# Patient Record
Sex: Male | Born: 1950 | Race: White | Hispanic: No | Marital: Married | Smoking: Never smoker
Health system: Southern US, Community
[De-identification: ages and names within clinical notes are randomized; demographics above are authoritative.]

## PROBLEM LIST (undated history)

## (undated) DIAGNOSIS — G709 Myoneural disorder, unspecified: Secondary | ICD-10-CM

## (undated) DIAGNOSIS — K219 Gastro-esophageal reflux disease without esophagitis: Secondary | ICD-10-CM

## (undated) DIAGNOSIS — M1990 Unspecified osteoarthritis, unspecified site: Secondary | ICD-10-CM

## (undated) HISTORY — PX: JOINT REPLACEMENT: SHX530

## (undated) HISTORY — PX: COLONOSCOPY: SHX5424

## (undated) HISTORY — PX: WRIST ARTHROSCOPY: SUR100

## (undated) HISTORY — PX: SHOULDER ARTHROSCOPY: SHX128

## (undated) HISTORY — PX: OTHER SURGICAL HISTORY: SHX169

---

## 2017-02-28 ENCOUNTER — Other Ambulatory Visit: Payer: Self-pay | Admitting: Neurosurgery

## 2017-03-20 ENCOUNTER — Encounter (HOSPITAL_COMMUNITY): Payer: Self-pay | Admitting: Vascular Surgery

## 2017-03-20 ENCOUNTER — Encounter (HOSPITAL_COMMUNITY): Payer: Self-pay

## 2017-03-20 ENCOUNTER — Encounter (HOSPITAL_COMMUNITY)
Admission: RE | Admit: 2017-03-20 | Discharge: 2017-03-20 | Disposition: A | Discharge status: Home / Self Care | Payer: BLUE CROSS/BLUE SHIELD | Source: Ambulatory Visit | Attending: Neurosurgery | Admitting: Neurosurgery

## 2017-03-20 DIAGNOSIS — M4316 Spondylolisthesis, lumbar region: Secondary | ICD-10-CM | POA: Diagnosis not present

## 2017-03-20 DIAGNOSIS — Z01818 Encounter for other preprocedural examination: Secondary | ICD-10-CM | POA: Insufficient documentation

## 2017-03-20 HISTORY — DX: Unspecified osteoarthritis, unspecified site: M19.90

## 2017-03-20 HISTORY — DX: Myoneural disorder, unspecified: G70.9

## 2017-03-20 HISTORY — DX: Gastro-esophageal reflux disease without esophagitis: K21.9

## 2017-03-20 LAB — CBC
HCT: 41.2 % (ref 39.0–52.0)
Hemoglobin: 13.8 g/dL (ref 13.0–17.0)
MCH: 29.9 pg (ref 26.0–34.0)
MCHC: 33.5 g/dL (ref 30.0–36.0)
MCV: 89.2 fL (ref 78.0–100.0)
PLATELETS: 270 10*3/uL (ref 150–400)
RBC: 4.62 MIL/uL (ref 4.22–5.81)
RDW: 13.9 % (ref 11.5–15.5)
WBC: 8.3 10*3/uL (ref 4.0–10.5)

## 2017-03-20 LAB — BASIC METABOLIC PANEL
ANION GAP: 7 (ref 5–15)
BUN: 13 mg/dL (ref 6–20)
CO2: 30 mmol/L (ref 22–32)
Calcium: 10 mg/dL (ref 8.9–10.3)
Chloride: 100 mmol/L — ABNORMAL LOW (ref 101–111)
Creatinine, Ser: 0.91 mg/dL (ref 0.61–1.24)
GFR calc non Af Amer: >60 mL/min (ref >60)
Glucose, Bld: 104 mg/dL — ABNORMAL HIGH (ref 65–99)
Potassium: 4.3 mmol/L (ref 3.5–5.1)
Sodium: 137 mmol/L (ref 135–145)

## 2017-03-20 LAB — SURGICAL PCR SCREEN
MRSA, PCR: NEGATIVE
Staphylococcus aureus: NEGATIVE

## 2017-03-20 NOTE — Progress Notes (Addendum)
PCP is Dr Graceann CongressGarland Hughes  Denies any chest pain  States he had a stress test, card cath, and echo 10 years ago and everything was normal. Dr Enis GashHuggins asked if he could leave his wedding ring on during surgery. Informed him that we usually tell him to remove all jewelry. States that he has left the ring on for all his other surgeries and has never removed it, and wants to leave it on during this surgery. Revonda Standardllison called and she spoke with Dr Maple HudsonMoser - he states it will need to be removed. Informed Dr Enis GashHuggins of this and he states that he will sign what ever he needs to sign so it can stay on.  States he would cancel surgery if he has to remove it. Encouraged him to speak with the Anesthesia Dr on the day of surgery.

## 2017-03-20 NOTE — Pre-Procedure Instructions (Signed)
Sim Choquette  03/20/2017      [facility] - [place], [place] - [address] [facility] [place] [address] Phone: [phone] Fax: [phone]     Your procedure is scheduled on May 31.  Report to [facility]mitting at [phone].M.  Call this number if you have problems the morning of surgery:  [phone]   Remember:  Do not eat food or drink liquids after midnight.  Take these medicines the morning of surgery with A SIP OF WATER Oxycodone-acetaminophen (Percocet) if needed, Tamsulosin (Flomax)  Stop taking aspirin, BC's, Goody's, Herbal medications, Fish Oil, Vitamins, Ibuprofen, Advil, Motrin   Do not wear jewelry, make-up or nail polish.  Do not wear lotions, powders, or perfumes, or deoderant.  Do not shave 48 hours prior to surgery.  Men may shave face and neck.  Do not bring valuables to the hospital.  [facility] is not responsible for any belongings or valuables.  Contacts, dentures or bridgework may not be worn into surgery.  Leave your suitcase in the car.  After surgery it may be brought to your room.  For patients admitted to the hospital, discharge time will be determined by your treatment team.  Patients discharged the day of surgery will not be allowed to drive home.    Special instructions:  [facility] - Preparing for Surgery  Before surgery, you can play an important role.  Because skin is not sterile, your skin needs to be as free of germs as possible.  You can reduce the number of germs on you skin by washing with CHG (chlorahexidine gluconate) soap before surgery.  CHG is an antiseptic cleaner which kills germs and bonds with the skin to continue killing germs even after washing.  Please DO NOT use if you have an allergy to CHG or antibacterial soaps.  If your skin becomes reddened/irritated stop using the CHG and inform your nurse when you arrive at Short Stay.  Do not shave (including legs and  underarms) for at least 48 hours prior to the first CHG shower.  You may shave your face.  Please follow these instructions carefully:   1.  Shower with CHG Soap the night before surgery and the                                morning of Surgery.  2.  If you choose to wash your hair, wash your hair first as usual with your       normal shampoo.  3.  After you shampoo, rinse your hair and body thoroughly to remove the                      Shampoo.  4.  Use CHG as you would any other liquid soap.  You can apply chg directly       to the skin and wash gently with scrungie or a clean washcloth.  5.  Apply the CHG Soap to your body ONLY FROM THE NECK DOWN.        Do not use on open wounds or open sores.  Avoid contact with your eyes,       ears, mouth and genitals (private parts).  Wash genitals (private parts)       with your normal soap.  6.  Wash thoroughly, paying special attention to the area where  your surgery        will be performed.  7.  Thoroughly rinse your body with warm water from the neck down.  8.  DO NOT shower/wash with your normal soap after using and rinsing off       the CHG Soap.  9.  Pat yourself dry with a clean towel.            10.  Wear clean pajamas.            11.  Place clean sheets on your bed the night of your first shower and do not        sleep with pets.  Day of Surgery  Do not apply any lotions/deoderants the morning of surgery.  Please wear clean clothes to the hospital/surgery center.     Please read over the following fact sheets that you were given. Pain Booklet, Coughing and Deep Breathing, MRSA Information and Surgical Site Infection Prevention

## 2017-03-21 NOTE — Anesthesia Preprocedure Evaluation (Addendum)
Anesthesia Evaluation  Patient identified by MRN, date of birth, ID band Patient awake    Reviewed: Allergy & Precautions, NPO status , Patient's Chart, lab work & pertinent test results  Airway Mallampati: II  TM Distance: >3 FB Neck ROM: Full    Dental  (+) Teeth Intact, Dental Advisory Given   Pulmonary    breath sounds clear to auscultation       Cardiovascular  Rhythm:Regular Rate:Normal     Neuro/Psych    GI/Hepatic GERD  ,  Endo/Other    Renal/GU      Musculoskeletal  (+) Arthritis , Osteoarthritis,    Abdominal   Peds  Hematology   Anesthesia Other Findings   Reproductive/Obstetrics                           Lab Results  Component Value Date   WBC 8.3 03/20/2017   HGB 13.8 03/20/2017   HCT 41.2 03/20/2017   MCV 89.2 03/20/2017   PLT 270 03/20/2017   Lab Results  Component Value Date   CREATININE 0.91 03/20/2017   BUN 13 03/20/2017   NA 137 03/20/2017   K 4.3 03/20/2017   CL 100 (L) 03/20/2017   CO2 30 03/20/2017    Anesthesia Physical Anesthesia Plan  ASA: II  Anesthesia Plan: General   Post-op Pain Management:    Induction: Intravenous  Airway Management Planned: Oral ETT  Additional Equipment:   Intra-op Plan:   Post-operative Plan: Extubation in OR  Informed Consent: I have reviewed the patients History and Physical, chart, labs and discussed the procedure including the risks, benefits and alternatives for the proposed anesthesia with the patient or authorized representative who has indicated his/her understanding and acceptance.   Dental advisory given  Plan Discussed with: CRNA, Anesthesiologist and Surgeon  Anesthesia Plan Comments:       Anesthesia Quick Evaluation

## 2017-03-29 ENCOUNTER — Inpatient Hospital Stay (HOSPITAL_COMMUNITY): Payer: BLUE CROSS/BLUE SHIELD | Admitting: Vascular Surgery

## 2017-03-29 ENCOUNTER — Encounter (HOSPITAL_COMMUNITY): Payer: Self-pay | Admitting: *Deleted

## 2017-03-29 ENCOUNTER — Inpatient Hospital Stay (HOSPITAL_COMMUNITY)
Admission: RE | Admit: 2017-03-29 | Discharge: 2017-03-30 | DRG: 460 | Disposition: A | Discharge status: Home / Self Care | Payer: BLUE CROSS/BLUE SHIELD | Source: Ambulatory Visit | Attending: Neurosurgery | Admitting: Neurosurgery

## 2017-03-29 ENCOUNTER — Encounter (HOSPITAL_COMMUNITY)
Admission: RE | Disposition: A | Discharge status: Home / Self Care | Payer: Self-pay | Source: Ambulatory Visit | Attending: Neurosurgery

## 2017-03-29 ENCOUNTER — Inpatient Hospital Stay (HOSPITAL_COMMUNITY): Payer: BLUE CROSS/BLUE SHIELD

## 2017-03-29 DIAGNOSIS — E7800 Pure hypercholesterolemia, unspecified: Secondary | ICD-10-CM | POA: Diagnosis present

## 2017-03-29 DIAGNOSIS — M4316 Spondylolisthesis, lumbar region: Secondary | ICD-10-CM | POA: Diagnosis present

## 2017-03-29 DIAGNOSIS — M1991 Primary osteoarthritis, unspecified site: Secondary | ICD-10-CM | POA: Diagnosis present

## 2017-03-29 DIAGNOSIS — G629 Polyneuropathy, unspecified: Secondary | ICD-10-CM | POA: Diagnosis present

## 2017-03-29 DIAGNOSIS — M5116 Intervertebral disc disorders with radiculopathy, lumbar region: Principal | ICD-10-CM | POA: Diagnosis present

## 2017-03-29 DIAGNOSIS — Z419 Encounter for procedure for purposes other than remedying health state, unspecified: Secondary | ICD-10-CM

## 2017-03-29 DIAGNOSIS — Z96643 Presence of artificial hip joint, bilateral: Secondary | ICD-10-CM | POA: Diagnosis present

## 2017-03-29 DIAGNOSIS — Z79899 Other long term (current) drug therapy: Secondary | ICD-10-CM | POA: Diagnosis not present

## 2017-03-29 DIAGNOSIS — R030 Elevated blood-pressure reading, without diagnosis of hypertension: Secondary | ICD-10-CM | POA: Diagnosis present

## 2017-03-29 DIAGNOSIS — M4606 Spinal enthesopathy, lumbar region: Secondary | ICD-10-CM | POA: Diagnosis present

## 2017-03-29 HISTORY — PX: ANTERIOR LAT LUMBAR FUSION: SHX1168

## 2017-03-29 HISTORY — PX: LUMBAR PERCUTANEOUS PEDICLE SCREW 1 LEVEL: SHX5560

## 2017-03-29 LAB — TYPE AND SCREEN
ABO/RH(D): AB POS
ANTIBODY SCREEN: NEGATIVE

## 2017-03-29 LAB — ABO/RH: ABO/RH(D): AB POS

## 2017-03-29 SURGERY — ANTERIOR LATERAL LUMBAR FUSION 1 LEVEL
Anesthesia: General | Laterality: Right

## 2017-03-29 MED ORDER — ONDANSETRON HCL 4 MG/2ML IJ SOLN: 4.0000 mg | Freq: Four times a day (QID) | INTRAMUSCULAR | Status: DC | PRN

## 2017-03-29 MED ORDER — PANTOPRAZOLE SODIUM 40 MG PO TBEC
40.0000 mg | DELAYED_RELEASE_TABLET | Freq: Every day | ORAL | Status: DC
  Administered 2017-03-29: 40 mg via ORAL
  Filled 2017-03-29: qty 1

## 2017-03-29 MED ORDER — MIDAZOLAM HCL 2 MG/2ML IJ SOLN
INTRAMUSCULAR | Status: AC
  Filled 2017-03-29: qty 2

## 2017-03-29 MED ORDER — OXYCODONE-ACETAMINOPHEN 5-325 MG PO TABS
1.0000 | ORAL_TABLET | ORAL | Status: DC | PRN
  Administered 2017-03-29 – 2017-03-30 (×6): 1 via ORAL
  Filled 2017-03-29 (×3): qty 1
  Filled 2017-03-29: qty 2
  Filled 2017-03-29: qty 1

## 2017-03-29 MED ORDER — ACETAMINOPHEN 650 MG RE SUPP: 650.0000 mg | RECTAL | Status: DC | PRN

## 2017-03-29 MED ORDER — ATORVASTATIN CALCIUM 20 MG PO TABS
20.0000 mg | ORAL_TABLET | Freq: Every day | ORAL | Status: DC
  Administered 2017-03-29: 20 mg via ORAL
  Filled 2017-03-29: qty 1

## 2017-03-29 MED ORDER — DOCUSATE SODIUM 100 MG PO CAPS
100.0000 mg | ORAL_CAPSULE | Freq: Two times a day (BID) | ORAL | Status: DC
  Administered 2017-03-29 – 2017-03-30 (×2): 100 mg via ORAL
  Filled 2017-03-29 (×2): qty 1

## 2017-03-29 MED ORDER — BISACODYL 10 MG RE SUPP: 10.0000 mg | Freq: Every day | RECTAL | Status: DC | PRN

## 2017-03-29 MED ORDER — HYDROMORPHONE HCL 1 MG/ML IJ SOLN
INTRAMUSCULAR | Status: AC
  Filled 2017-03-29: qty 0.5

## 2017-03-29 MED ORDER — METHOCARBAMOL 1000 MG/10ML IJ SOLN
500.0000 mg | Freq: Four times a day (QID) | INTRAVENOUS | Status: DC | PRN
  Filled 2017-03-29: qty 5

## 2017-03-29 MED ORDER — ZOLPIDEM TARTRATE 5 MG PO TABS: 5.0000 mg | ORAL_TABLET | Freq: Every evening | ORAL | Status: DC | PRN

## 2017-03-29 MED ORDER — ZOLPIDEM TARTRATE 5 MG PO TABS
5.0000 mg | ORAL_TABLET | Freq: Every evening | ORAL | Status: DC | PRN
Start: 2017-03-29 — End: 2017-03-30

## 2017-03-29 MED ORDER — ONDANSETRON HCL 4 MG PO TABS: 4.0000 mg | ORAL_TABLET | Freq: Four times a day (QID) | ORAL | Status: DC | PRN

## 2017-03-29 MED ORDER — BUPIVACAINE LIPOSOME 1.3 % IJ SUSP
20.0000 mL | INTRAMUSCULAR | Status: AC
  Administered 2017-03-29: 20 mL
  Filled 2017-03-29: qty 20

## 2017-03-29 MED ORDER — OXYCODONE-ACETAMINOPHEN 5-325 MG PO TABS
ORAL_TABLET | ORAL | Status: AC
  Filled 2017-03-29: qty 1

## 2017-03-29 MED ORDER — FENTANYL CITRATE (PF) 250 MCG/5ML IJ SOLN
INTRAMUSCULAR | Status: AC
  Filled 2017-03-29: qty 5

## 2017-03-29 MED ORDER — HYDROCODONE-ACETAMINOPHEN 5-325 MG PO TABS: 1.0000 | ORAL_TABLET | ORAL | Status: DC | PRN

## 2017-03-29 MED ORDER — ACETAMINOPHEN 500 MG PO TABS: 1000.0000 mg | ORAL_TABLET | Freq: Four times a day (QID) | ORAL | Status: DC | PRN

## 2017-03-29 MED ORDER — PHENOL 1.4 % MT LIQD
1.0000 | OROMUCOSAL | Status: DC | PRN
Start: 2017-03-29 — End: 2017-03-30

## 2017-03-29 MED ORDER — THROMBIN 5000 UNITS EX SOLR
CUTANEOUS | Status: DC | PRN
  Administered 2017-03-29 (×2): 5000 [IU] via TOPICAL

## 2017-03-29 MED ORDER — PROMETHAZINE HCL 25 MG/ML IJ SOLN: 6.2500 mg | INTRAMUSCULAR | Status: DC | PRN

## 2017-03-29 MED ORDER — BUPIVACAINE HCL (PF) 0.5 % IJ SOLN
INTRAMUSCULAR | Status: AC
  Filled 2017-03-29: qty 30

## 2017-03-29 MED ORDER — PROPOFOL 10 MG/ML IV BOLUS
INTRAVENOUS | Status: AC
  Filled 2017-03-29: qty 20

## 2017-03-29 MED ORDER — MENTHOL 3 MG MT LOZG: 1.0000 | LOZENGE | OROMUCOSAL | Status: DC | PRN

## 2017-03-29 MED ORDER — ACETAMINOPHEN 325 MG PO TABS
650.0000 mg | ORAL_TABLET | ORAL | Status: DC | PRN
  Administered 2017-03-29 – 2017-03-30 (×3): 650 mg via ORAL
  Filled 2017-03-29 (×3): qty 2

## 2017-03-29 MED ORDER — MIDAZOLAM HCL 5 MG/5ML IJ SOLN
INTRAMUSCULAR | Status: DC | PRN
  Administered 2017-03-29: 2 mg via INTRAVENOUS

## 2017-03-29 MED ORDER — LIDOCAINE-EPINEPHRINE 1 %-1:100000 IJ SOLN
INTRAMUSCULAR | Status: DC | PRN
  Administered 2017-03-29: 5 mL
  Administered 2017-03-29: 3 mL

## 2017-03-29 MED ORDER — HYDROMORPHONE HCL 1 MG/ML IJ SOLN
0.2500 mg | INTRAMUSCULAR | Status: DC | PRN
  Administered 2017-03-29 (×3): 0.5 mg via INTRAVENOUS

## 2017-03-29 MED ORDER — PREGABALIN 75 MG PO CAPS
225.0000 mg | ORAL_CAPSULE | Freq: Every day | ORAL | Status: DC
  Administered 2017-03-29: 225 mg via ORAL
  Filled 2017-03-29: qty 3

## 2017-03-29 MED ORDER — EPHEDRINE SULFATE 50 MG/ML IJ SOLN
INTRAMUSCULAR | Status: DC | PRN
  Administered 2017-03-29 (×2): 5 mg via INTRAVENOUS
  Administered 2017-03-29 (×2): 10 mg via INTRAVENOUS
  Administered 2017-03-29 (×2): 5 mg via INTRAVENOUS

## 2017-03-29 MED ORDER — FLEET ENEMA 7-19 GM/118ML RE ENEM: 1.0000 | ENEMA | Freq: Once | RECTAL | Status: DC | PRN

## 2017-03-29 MED ORDER — CEFAZOLIN SODIUM-DEXTROSE 2-4 GM/100ML-% IV SOLN
2.0000 g | INTRAVENOUS | Status: AC
  Administered 2017-03-29: 2 g via INTRAVENOUS

## 2017-03-29 MED ORDER — LIDOCAINE-EPINEPHRINE 1 %-1:100000 IJ SOLN
INTRAMUSCULAR | Status: AC
  Filled 2017-03-29: qty 1

## 2017-03-29 MED ORDER — FENTANYL CITRATE (PF) 100 MCG/2ML IJ SOLN
INTRAMUSCULAR | Status: DC | PRN
  Administered 2017-03-29 (×4): 50 ug via INTRAVENOUS
  Administered 2017-03-29: 100 ug via INTRAVENOUS

## 2017-03-29 MED ORDER — ONDANSETRON HCL 4 MG/2ML IJ SOLN
INTRAMUSCULAR | Status: AC
  Filled 2017-03-29: qty 2

## 2017-03-29 MED ORDER — SUCCINYLCHOLINE CHLORIDE 200 MG/10ML IV SOSY
PREFILLED_SYRINGE | INTRAVENOUS | Status: DC | PRN
  Administered 2017-03-29: 100 mg via INTRAVENOUS

## 2017-03-29 MED ORDER — CEFAZOLIN SODIUM-DEXTROSE 2-4 GM/100ML-% IV SOLN: 2.0000 g | INTRAVENOUS | Status: DC

## 2017-03-29 MED ORDER — THROMBIN 5000 UNITS EX SOLR
CUTANEOUS | Status: AC
  Filled 2017-03-29: qty 15000

## 2017-03-29 MED ORDER — ONDANSETRON HCL 4 MG/2ML IJ SOLN
INTRAMUSCULAR | Status: DC | PRN
  Administered 2017-03-29: 4 mg via INTRAVENOUS

## 2017-03-29 MED ORDER — MORPHINE SULFATE (PF) 4 MG/ML IV SOLN: 1.0000 mg | INTRAVENOUS | Status: DC | PRN

## 2017-03-29 MED ORDER — OXYCODONE-ACETAMINOPHEN 7.5-325 MG PO TABS: 0.5000 | ORAL_TABLET | Freq: Two times a day (BID) | ORAL | Status: DC | PRN

## 2017-03-29 MED ORDER — HEMOSTATIC AGENTS (NO CHARGE) OPTIME
TOPICAL | Status: DC | PRN
  Administered 2017-03-29: 1 via TOPICAL

## 2017-03-29 MED ORDER — LACTATED RINGERS IV SOLN
INTRAVENOUS | Status: DC
  Administered 2017-03-29 (×4): via INTRAVENOUS

## 2017-03-29 MED ORDER — PREGABALIN 75 MG PO CAPS: 225.0000 mg | ORAL_CAPSULE | Freq: Every day | ORAL | Status: DC

## 2017-03-29 MED ORDER — SODIUM CHLORIDE 0.9% FLUSH
3.0000 mL | Freq: Two times a day (BID) | INTRAVENOUS | Status: DC
  Administered 2017-03-29: 3 mL via INTRAVENOUS

## 2017-03-29 MED ORDER — LIDOCAINE HCL (CARDIAC) 20 MG/ML IV SOLN
INTRAVENOUS | Status: DC | PRN
  Administered 2017-03-29: 100 mg via INTRAVENOUS

## 2017-03-29 MED ORDER — BUPIVACAINE HCL (PF) 0.5 % IJ SOLN
INTRAMUSCULAR | Status: DC | PRN
  Administered 2017-03-29: 3 mL
  Administered 2017-03-29: 5 mL

## 2017-03-29 MED ORDER — PANTOPRAZOLE SODIUM 40 MG IV SOLR: 40.0000 mg | Freq: Every day | INTRAVENOUS | Status: DC

## 2017-03-29 MED ORDER — KCL IN DEXTROSE-NACL 20-5-0.45 MEQ/L-%-% IV SOLN: INTRAVENOUS | Status: DC

## 2017-03-29 MED ORDER — METHOCARBAMOL 500 MG PO TABS
500.0000 mg | ORAL_TABLET | Freq: Four times a day (QID) | ORAL | Status: DC | PRN
  Administered 2017-03-30: 500 mg via ORAL
  Filled 2017-03-29 (×2): qty 1

## 2017-03-29 MED ORDER — CHLORHEXIDINE GLUCONATE CLOTH 2 % EX PADS: 6.0000 | MEDICATED_PAD | Freq: Once | CUTANEOUS | Status: DC

## 2017-03-29 MED ORDER — TAMSULOSIN HCL 0.4 MG PO CAPS
0.4000 mg | ORAL_CAPSULE | Freq: Every day | ORAL | Status: DC
Start: 2017-03-30 — End: 2017-03-30
  Administered 2017-03-30: 0.4 mg via ORAL
  Filled 2017-03-29 (×2): qty 1

## 2017-03-29 MED ORDER — CEFAZOLIN SODIUM-DEXTROSE 2-4 GM/100ML-% IV SOLN
2.0000 g | Freq: Three times a day (TID) | INTRAVENOUS | Status: AC
  Administered 2017-03-29 – 2017-03-30 (×2): 2 g via INTRAVENOUS
  Filled 2017-03-29: qty 100

## 2017-03-29 MED ORDER — DEXTROSE 5 % IV SOLN
INTRAVENOUS | Status: DC | PRN
  Administered 2017-03-29: 20 ug/min via INTRAVENOUS

## 2017-03-29 MED ORDER — SODIUM CHLORIDE 0.9 % IV SOLN: 250.0000 mL | INTRAVENOUS | Status: DC

## 2017-03-29 MED ORDER — CEFAZOLIN SODIUM-DEXTROSE 2-4 GM/100ML-% IV SOLN
INTRAVENOUS | Status: AC
  Filled 2017-03-29: qty 100

## 2017-03-29 MED ORDER — SODIUM CHLORIDE 0.9% FLUSH
3.0000 mL | INTRAVENOUS | Status: DC | PRN
Start: 2017-03-29 — End: 2017-03-30

## 2017-03-29 MED ORDER — 0.9 % SODIUM CHLORIDE (POUR BTL) OPTIME
TOPICAL | Status: DC | PRN
  Administered 2017-03-29: 1000 mL

## 2017-03-29 MED ORDER — ALUM & MAG HYDROXIDE-SIMETH 200-200-20 MG/5ML PO SUSP: 30.0000 mL | Freq: Four times a day (QID) | ORAL | Status: DC | PRN

## 2017-03-29 MED ORDER — PROPOFOL 10 MG/ML IV BOLUS
INTRAVENOUS | Status: DC | PRN
  Administered 2017-03-29: 200 mg via INTRAVENOUS

## 2017-03-29 MED ORDER — SENNOSIDES-DOCUSATE SODIUM 8.6-50 MG PO TABS: 1.0000 | ORAL_TABLET | Freq: Every evening | ORAL | Status: DC | PRN

## 2017-03-29 SURGICAL SUPPLY — 81 items
BLADE CLIPPER SURG (BLADE) IMPLANT
CAGE MODULUS XLW 10X22X60 - 10 (Cage) ×3 IMPLANT
CARTRIDGE OIL MAESTRO DRILL (MISCELLANEOUS) IMPLANT
CLIP NEUROVISION LG (CLIP) ×6 IMPLANT
CONT SPEC 4OZ CLIKSEAL STRL BL (MISCELLANEOUS) ×3 IMPLANT
COUNTER NEEDLE 20 DBL MAG RED (NEEDLE) ×3 IMPLANT
COVER BACK TABLE 24X17X13 BIG (DRAPES) IMPLANT
COVER BACK TABLE 60X90IN (DRAPES) ×3 IMPLANT
DECANTER SPIKE VIAL GLASS SM (MISCELLANEOUS) IMPLANT
DERMABOND ADVANCED (GAUZE/BANDAGES/DRESSINGS) ×4
DERMABOND ADVANCED .7 DNX12 (GAUZE/BANDAGES/DRESSINGS) ×8 IMPLANT
DIFFUSER DRILL AIR PNEUMATIC (MISCELLANEOUS) IMPLANT
DRAPE C-ARM 42X72 X-RAY (DRAPES) ×6 IMPLANT
DRAPE C-ARMOR (DRAPES) ×6 IMPLANT
DRAPE LAPAROTOMY 100X72X124 (DRAPES) ×6 IMPLANT
DRAPE POUCH INSTRU U-SHP 10X18 (DRAPES) ×6 IMPLANT
DRAPE SURG 17X23 STRL (DRAPES) ×3 IMPLANT
DRSG OPSITE POSTOP 3X4 (GAUZE/BANDAGES/DRESSINGS) ×15 IMPLANT
DURAPREP 26ML APPLICATOR (WOUND CARE) ×6 IMPLANT
ELECT REM PT RETURN 9FT ADLT (ELECTROSURGICAL) ×6
ELECTRODE REM PT RTRN 9FT ADLT (ELECTROSURGICAL) ×4 IMPLANT
GAUZE SPONGE 4X4 12PLY STRL (GAUZE/BANDAGES/DRESSINGS) IMPLANT
GAUZE SPONGE 4X4 16PLY XRAY LF (GAUZE/BANDAGES/DRESSINGS) ×3 IMPLANT
GLOVE BIO SURGEON STRL SZ8 (GLOVE) ×12 IMPLANT
GLOVE BIOGEL PI IND STRL 6.5 (GLOVE) ×2 IMPLANT
GLOVE BIOGEL PI IND STRL 7.5 (GLOVE) ×2 IMPLANT
GLOVE BIOGEL PI IND STRL 8 (GLOVE) ×4 IMPLANT
GLOVE BIOGEL PI IND STRL 8.5 (GLOVE) ×4 IMPLANT
GLOVE BIOGEL PI INDICATOR 6.5 (GLOVE) ×1
GLOVE BIOGEL PI INDICATOR 7.5 (GLOVE) ×1
GLOVE BIOGEL PI INDICATOR 8 (GLOVE) ×2
GLOVE BIOGEL PI INDICATOR 8.5 (GLOVE) ×2
GLOVE ECLIPSE 8.0 STRL XLNG CF (GLOVE) ×6 IMPLANT
GLOVE EXAM NITRILE LRG STRL (GLOVE) IMPLANT
GLOVE EXAM NITRILE XL STR (GLOVE) IMPLANT
GLOVE EXAM NITRILE XS STR PU (GLOVE) IMPLANT
GLOVE SURG SS PI 6.5 STRL IVOR (GLOVE) ×3 IMPLANT
GLOVE SURG SS PI 7.5 STRL IVOR (GLOVE) ×9 IMPLANT
GOWN STRL REUS W/ TWL LRG LVL3 (GOWN DISPOSABLE) ×4 IMPLANT
GOWN STRL REUS W/ TWL XL LVL3 (GOWN DISPOSABLE) ×6 IMPLANT
GOWN STRL REUS W/TWL 2XL LVL3 (GOWN DISPOSABLE) ×6 IMPLANT
GOWN STRL REUS W/TWL LRG LVL3 (GOWN DISPOSABLE) ×2
GOWN STRL REUS W/TWL XL LVL3 (GOWN DISPOSABLE) ×3
GUIDEWIRE NITINOL BEVEL TIP (WIRE) ×12 IMPLANT
KIT BASIN OR (CUSTOM PROCEDURE TRAY) ×3 IMPLANT
KIT DILATOR XLIF 5 (KITS) ×2 IMPLANT
KIT INFUSE XX SMALL 0.7CC (Orthopedic Implant) ×3 IMPLANT
KIT POSITION SURG JACKSON T1 (MISCELLANEOUS) ×3 IMPLANT
KIT ROOM TURNOVER OR (KITS) ×3 IMPLANT
KIT SURGICAL ACCESS MAXCESS 4 (KITS) ×3 IMPLANT
KIT XLIF (KITS) ×1
MARKER SKIN DUAL TIP RULER LAB (MISCELLANEOUS) ×3 IMPLANT
MODULE NVM5 NEXT GEN EMG (NEEDLE) ×3 IMPLANT
NEEDLE HYPO 21X1.5 SAFETY (NEEDLE) ×6 IMPLANT
NEEDLE HYPO 25X1 1.5 SAFETY (NEEDLE) ×6 IMPLANT
NEEDLE I PASS (NEEDLE) ×3 IMPLANT
NS IRRIG 1000ML POUR BTL (IV SOLUTION) ×3 IMPLANT
OIL CARTRIDGE MAESTRO DRILL (MISCELLANEOUS)
PACK LAMINECTOMY NEURO (CUSTOM PROCEDURE TRAY) ×6 IMPLANT
PAD ARMBOARD 7.5X6 YLW CONV (MISCELLANEOUS) ×15 IMPLANT
PATTIES SURGICAL .5 X.5 (GAUZE/BANDAGES/DRESSINGS) IMPLANT
PATTIES SURGICAL .5 X1 (DISPOSABLE) IMPLANT
PATTIES SURGICAL 1X1 (DISPOSABLE) IMPLANT
PUTTY BONE ATTRAX 5CC STRIP (Putty) ×3 IMPLANT
ROD RELINE MAS TI LORD 5.5X40 (Rod) ×6 IMPLANT
SCREW LOCK RELINE 5.5 TULIP (Screw) ×12 IMPLANT
SCREW MAS RELINE 6.5X45 POLY (Screw) ×6 IMPLANT
SCREW MAS RELINE 6.5X50 POLY (Screw) ×6 IMPLANT
SPONGE LAP 4X18 X RAY DECT (DISPOSABLE) IMPLANT
SPONGE SURGIFOAM ABS GEL SZ50 (HEMOSTASIS) ×3 IMPLANT
STAPLER SKIN PROX WIDE 3.9 (STAPLE) ×3 IMPLANT
SUT VIC AB 1 CT1 18XBRD ANBCTR (SUTURE) ×4 IMPLANT
SUT VIC AB 1 CT1 8-18 (SUTURE) ×2
SUT VIC AB 2-0 CT1 18 (SUTURE) ×6 IMPLANT
SUT VIC AB 3-0 SH 8-18 (SUTURE) ×6 IMPLANT
SYR 20CC LL (SYRINGE) ×3 IMPLANT
SYR INSULIN 1ML 31GX6 SAFETY (SYRINGE) IMPLANT
TOWEL GREEN STERILE (TOWEL DISPOSABLE) ×3 IMPLANT
TOWEL GREEN STERILE FF (TOWEL DISPOSABLE) ×3 IMPLANT
TRAY FOLEY W/METER SILVER 16FR (SET/KITS/TRAYS/PACK) ×3 IMPLANT
WATER STERILE IRR 1000ML POUR (IV SOLUTION) ×3 IMPLANT

## 2017-03-29 NOTE — Interval H&P Note (Signed)
History and Physical Interval Note:  03/29/2017 8:49 AM  Omar PollackHenry L Urizar Jr.  has presented today for surgery, with the diagnosis of Spondylolisthesis, Lumbar region  The various methods of treatment have been discussed with the patient and family. After consideration of risks, benefits and other options for treatment, the patient has consented to  Procedure(s) with comments: Right L4-5 Anterolateral lumbar interbody fusion with percutaneous pedicle screws (Right) - Right L4-5 Anterolateral lumbar interbody fusion with percutaneous pedicle screws LUMBAR PERCUTANEOUS PEDICLE SCREW 1 LEVEL (N/A) as a surgical intervention .  The patient's history has been reviewed, patient examined, no change in status, stable for surgery.  I have reviewed the patient's chart and labs.  Questions were answered to the patient's satisfaction.     Latamara Melder D

## 2017-03-29 NOTE — Brief Op Note (Signed)
03/29/2017  1:09 PM  PATIENT:  Omar Conway.  66 y.o. 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  PROCEDURE:  Procedure(s) with comments: Right Lumbar four to five,  Anterolateral lumbar interbody fusion with percutaneous pedicle screws (Right) - Right Lumbar four to five, Anterolateral lumbar interbody fusion with percutaneous pedicle screws LUMBAR PERCUTANEOUS PEDICLE SCREW 1 LEVEL (N/A)  SURGEON:  Surgeon(s) and Role:    Maeola Harman, MD - Primary    * Tia Alert, MD - Assisting  PHYSICIAN ASSISTANT:   ASSISTANTS: Poteat, RN   ANESTHESIA:   general  EBL:  Total I/O In: 2000 [I.V.:2000] Out: 850 [Urine:750; Blood:100]  BLOOD ADMINISTERED:none  DRAINS: none   LOCAL MEDICATIONS USED:  MARCAINE    and LIDOCAINE   SPECIMEN:  No Specimen  DISPOSITION OF SPECIMEN:  N/A  COUNTS:  YES  TOURNIQUET:  * No tourniquets in log *  DICTATION: Patient is a 66 year old with  Spondylolysis, spondylolisthesis L 45 stenosis, lumbago, radiculopathy of the lumbar spine. It was elected to take him to surgery for anterolateral decompression and posterior percutaneous pedicle screw fixation at the L 45 level.    Procedure: Patient was brought to the operating room and placed in a left lateral decubitus position on the operative table and using orthogonally projected C-arm fluoroscopy the patient was placed so that the L 45 level was visualized in AP and lateral plane. The patient was then taped into position. The table was flexed so as to expose the L 45 level as the patient has a high iliac crest. Skin was marked along with a posterior finger dissection incision. His flank was then prepped and draped in usual sterile fashion and incisions were made sequentially at L 45 level  from the right side. Posterior finger dissection was made to enter the retroperitoneal space and then subsequently the probe was inserted into the psoas muscle from the right side initially at the L 45 level. After mapping the neural elements were able to dock the probe per the midpoint of this vertebral level and without indications electrically of too close proximity to the neural tissues. Subsequently the self-retaining tractor was.after sequential dilators were utilized the shim was employed and the interspace was cleared of psoas muscle and then incised. A thorough discectomy was performed. Angled instruments were used to clear the interspace of disc material.An anterior entry with posterior trajectory was performed to avoid neural elements.   After thorough discectomy was performed and this was performed using AP and lateral fluoroscopy a 10 lordotic by 60 x 22 mm implant was packed with extra extra small BMP and Attrax. This was tamped into position using the slides and its position was confirmed on AP and lateral fluoroscopy. Hemostasis was assured the wounds were irrigated and closed with interrupted Vicryl sutures.  Sterile occlusive dressings were placed. Retractor time was:  L45: 20 minutes.   Patient was then turned into a prone position on the operating table on Jackson table and using AP and lateral fluoroscopy throughout this portion of the procedure, pedicle screws were placed using Reline Nuvasive cannulated percutaneous screws. 2 screws were placed at L4 and (6.5 x 50 mm) and 2 at L5 (6.5 x 45).  40 mm rod was then affixed to the screw heads do a separate stab incision and locked down on the screws on  the left and 40 mm rod on the right. All connections were then torqued and the Towers were disassembled. The wounds were irrigated and then closed with 1, 2-0 and 3-0 Vicryl stitches. Sterile occlusive dressing was placed with Dermabond. Long-acting Marcaine was injected. The patient was then  extubated in the operating room and taken to recovery in stable and satisfactory condition having tolerated his operation well. Counts were correct at the end of the case.  Pelvic Parameters:  Preop: PT 16; PI 67; LL73; PI-LL -6  PLAN OF CARE: Admit to inpatient   PATIENT DISPOSITION:  PACU - hemodynamically stable.   Delay start of Pharmacological VTE agent (>24hrs) due to surgical blood loss or risk of bleeding: yes

## 2017-03-29 NOTE — Op Note (Signed)
03/29/2017  1:09 PM  PATIENT:  Omar Conway.  66 y.o. 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  PROCEDURE:  Procedure(s) with comments: Right Lumbar four to five,  Anterolateral lumbar interbody fusion with percutaneous pedicle screws (Right) - Right Lumbar four to five, Anterolateral lumbar interbody fusion with percutaneous pedicle screws LUMBAR PERCUTANEOUS PEDICLE SCREW 1 LEVEL (N/A)  SURGEON:  Surgeon(s) and Role:    Maeola Harman, MD - Primary    * Tia Alert, MD - Assisting  PHYSICIAN ASSISTANT:   ASSISTANTS: Poteat, RN   ANESTHESIA:   general  EBL:  Total I/O In: 2000 [I.V.:2000] Out: 850 [Urine:750; Blood:100]  BLOOD ADMINISTERED:none  DRAINS: none   LOCAL MEDICATIONS USED:  MARCAINE    and LIDOCAINE   SPECIMEN:  No Specimen  DISPOSITION OF SPECIMEN:  N/A  COUNTS:  YES  TOURNIQUET:  * No tourniquets in log *  DICTATION: Patient is a 66 year old with  Spondylolysis, spondylolisthesis L 45 stenosis, lumbago, radiculopathy of the lumbar spine. It was elected to take him to surgery for anterolateral decompression and posterior percutaneous pedicle screw fixation at the L 45 level.    Procedure: Patient was brought to the operating room and placed in a left lateral decubitus position on the operative table and using orthogonally projected C-arm fluoroscopy the patient was placed so that the L 45 level was visualized in AP and lateral plane. The patient was then taped into position. The table was flexed so as to expose the L 45 level as the patient has a high iliac crest. Skin was marked along with a posterior finger dissection incision. His flank was then prepped and draped in usual sterile fashion and incisions were made sequentially at L 45 level  from the right side. Posterior finger dissection was made to enter the retroperitoneal space and then subsequently the probe was inserted into the psoas muscle from the right side initially at the L 45 level. After mapping the neural elements were able to dock the probe per the midpoint of this vertebral level and without indications electrically of too close proximity to the neural tissues. Subsequently the self-retaining tractor was.after sequential dilators were utilized the shim was employed and the interspace was cleared of psoas muscle and then incised. A thorough discectomy was performed. Angled instruments were used to clear the interspace of disc material.An anterior entry with posterior trajectory was performed to avoid neural elements.   After thorough discectomy was performed and this was performed using AP and lateral fluoroscopy a 10 lordotic by 60 x 22 mm implant was packed with extra extra small BMP and Attrax. This was tamped into position using the slides and its position was confirmed on AP and lateral fluoroscopy. Hemostasis was assured the wounds were irrigated and closed with interrupted Vicryl sutures.  Sterile occlusive dressings were placed. Retractor time was:  L45: 20 minutes.   Patient was then turned into a prone position on the operating table on Jackson table and using AP and lateral fluoroscopy throughout this portion of the procedure, pedicle screws were placed using Reline Nuvasive cannulated percutaneous screws. 2 screws were placed at L4 and (6.5 x 50 mm) and 2 at L5 (6.5 x 45).  40 mm rod was then affixed to the screw heads do a separate stab incision and locked down on the screws on  the left and 40 mm rod on the right. All connections were then torqued and the Towers were disassembled. The wounds were irrigated and then closed with 1, 2-0 and 3-0 Vicryl stitches. Sterile occlusive dressing was placed with Dermabond. Long-acting Marcaine was injected. The patient was then  extubated in the operating room and taken to recovery in stable and satisfactory condition having tolerated his operation well. Counts were correct at the end of the case.  Pelvic Parameters:  Preop: PT 16; PI 67; LL73; PI-LL -6  PLAN OF CARE: Admit to inpatient   PATIENT DISPOSITION:  PACU - hemodynamically stable.   Delay start of Pharmacological VTE agent (>24hrs) due to surgical blood loss or risk of bleeding: yes

## 2017-03-29 NOTE — Anesthesia Procedure Notes (Signed)
Procedure Name: Intubation Date/Time: 03/29/2017 10:08 AM Performed by: Carney Living Pre-anesthesia Checklist: Patient identified, Emergency Drugs available, Suction available, Patient being monitored and Timeout performed Oxygen Delivery Method: Circle system utilized Preoxygenation: Pre-oxygenation with 100% oxygen Intubation Type: IV induction Ventilation: Mask ventilation without difficulty Laryngoscope Size: Mac and 4 Grade View: Grade II Tube type: Oral Tube size: 7.5 mm Number of attempts: 1 Airway Equipment and Method: Stylet Placement Confirmation: ETT inserted through vocal cords under direct vision,  positive ETCO2 and breath sounds checked- equal and bilateral Secured at: 22 cm Tube secured with: Tape Dental Injury: Teeth and Oropharynx as per pre-operative assessment

## 2017-03-29 NOTE — H&P (Signed)
Patient ID:   [id number] Patient: Omar Boston, MD  Date of Birth: [birth year] Visit Type: Office Visit   Date: 02/26/2017 01:15 PM Provider: Danae Orleans. Venetia Maxon MD   This 66 year old male presents for back pain.  History of Present Illness: 1.  back pain    Omar Conway, 66 y/o male, presents with persistent numbness in his feet. He received a facet injection previously which offered no relief. he reports that his symptoms have worsened since his last visit. He was diagnosed with idiopathic neuropathy 7 or 8 years ago. He reports aching in left leg greater than right. [place] and expressed a desire to resolve his back pain and return to golf. The patient also mentioned that he had been diagnosed with psoas tendinopathy and was concerned about how this would impact a potential XLIF at L4-5.      Medical/Surgical/Interim History Reviewed, no change.  Last detailed document date:02/01/2017.   Family History: Reviewed, no changes.  Last detailed document: 02/01/2017.   Social History: Tobacco use reviewed. Reviewed, no changes. Last detailed document date: 02/01/2017.      MEDICATIONS(added, continued or stopped this visit): Started Medication Directions Instruction Stopped   Ambien 5 mg tablet take 1 tablet by oral route  every day at bedtime     Lipitor 20 mg tablet take 1 tablet by oral route  every day     Lyrica 150 mg capsule take 1 capsule by oral route 2 times every day     Lyrica 75 mg capsule take 1 capsule by oral route 2 times every day     Percocet 7.5 mg-325 mg tablet take 1 tablet by oral route  every 6 hours as needed       ALLERGIES: Ingredient Reaction Medication Name Comment  NO KNOWN ALLERGIES     No known allergies. Reviewed, no changes.    Vitals Date Temp F BP Pulse Ht In Wt Lb BMI BSA Pain Score  02/26/2017  170/82 65 71 180.6 25.19  3/10      IMPRESSION reviewed previous x-rays with patient.  MRI  reveals spondylolysis of L4, L4-5 spondylolisthesis, grade 1-2. significant degenerative changes since last MRI.   discussed in detail right XLIF at L4-5 with percutaneous pedicle screws versus MAS PLIF at L4-5. My recommendation would be right L4-5 XLIF with percutaneous pedicle screws and MAS PLIF as fall back. I have reached this decision based on the anatomy of the patient's bones and nerves and in order to restore adequate disc height.   Completed Orders (this encounter) Order Details Reason Side Interpretation Result Initial Treatment Date Region  Hypertension education Continue to monitor blood pressure. If blood pressure remains elevated contact primary care provider        Dietary management education, guidance, and counseling patient encouraged to eat a well balanced diet         Assessment/Plan # Detail Type Description   1. Assessment Elevated blood-pressure reading, w/o diagnosis of htn (R03.0).       2. Assessment Body mass index (BMI) 25.0-25.9, adult (N56.21).   Plan Orders Today's instructions / counseling include(s) Dietary management education, guidance, and counseling.       3. 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       4. Assessment Radiculopathy, lumbar region (M54.16).       5. Assessment Spondylolisthesis, lumbar region (M43.16).   Plan Orders Aspen Lo Sag Rigid Panel Quick.  Pain Assessment/Treatment Pain Scale: 3/10. Method: Numeric Pain Intensity Scale. Location: back. Duration: varies. Quality: discomforting. Pain Assessment/Treatment follow-up plan of care: Patient taking medication as prescribed..  Fall Risk Plan The patient has not fallen in the last year.  nurse education given. scheduled right L4-5 XLIF with percutaneous pedicle screws, if not possible MAS PLIF at L4-5. fit for LSO brace.   Orders: Diagnostic Procedures: Assessment Procedure  M54.16 Lumbar Spine- AP/Lat  Instruction(s)/Education: Assessment Instruction   R03.0 Hypertension education  Z68.25 Dietary management education, guidance, and counseling  Miscellaneous: Assessment   M43.16 Aspen Lo Sag Rigid Panel Quick             Provider:  Danae Orleans. Venetia Maxon MD  02/26/2017 05:35 PM Dictation edited by: Philis Kendall    CC Providers: Maeola Harman MD [address] [place], [place] [address]              Electronically signed by Danae Orleans. Venetia Maxon MD on 03/01/2017 05:44 PM  Patient ID:   [id number] Patient: Omar Boston, MD  Date of Birth: April 25, 1951 Visit Type: Office Visit   Date: 02/01/2017 03:00 PM Provider: Danae Orleans. Venetia Maxon MD   This 66 year old male presents for back pain.  History of Present Illness: 1.  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  He reports increasing symptoms to daily x6 months.  He also notes chronic buttock pain since total hip replacement in 2003.  He has known of a lumbar spine listhesis since imaging of 2003.   Physical therapy and home exercises have provided no significant relief Steroid injection March 19th offered no lasting relief  Lyrica 150 mg and 75 mg q.h.s. for neuropathy Percocet 7.5/325-1/2 tab taken only as needed  History:  Arthritis, cholesterol, neuropathy (x5 years both feet followed by Neurology) Surgical history:  Right total hip 2003, right piriformis release 2005, right neurology cyst obturator internus nerve 2008, left total hip, left foot neuroma 2015, right wrist 2017  MRI October 2017 uploaded [facility]  The patient is describing low back pain 24/7 worse over the last 1-2 weeks.  He says that sitting or flexing or changing positions causes him significant discomfort.  He has had a long history of low back pain but it is considerably worse recently.  Other than neuropathy affecting his legs he does not complain of significant radicular pain.   He currently describes his low back pain is approximately 3/10 in severity.  He had bilateral hip replacements in 2003 and he says he has done well with that.  I sent the patient for lumbar radiographs would show that he has a mobile spondylolisthesis of L4 on L5 of 10 millimeters in neutral lateral position, a 0.6 millimeters in extension and 11 millimeters in flexion.  His MRI of his lumbar spine demonstrates significant facet arthropathy with edematous changes in the facet joints at the L3-4 level and in addition he has and L4  5 spondylolisthesis with L4 spondylolysis and with marked facet arthropathy at the L4-5 level.  The patient describes that he has seen a number of doctors who have made surgical recommendations and these have ranged from performing anterior decompression and fusion versus posterior decompression and fusion versus no surgery out of concern that he may have continued back pain.        PAST MEDICAL/SURGICAL HISTORY   (Detailed)  Disease/disorder Onset Date  Management Date Comments  Arthritis/Gout      High Cholesterol      Neuropathy         PAST MEDICAL HISTORY, SURGICAL HISTORY, FAMILY HISTORY, SOCIAL HISTORY AND REVIEW OF SYSTEMS I have reviewed the patient's past medical, surgical, family and social history as well as the comprehensive review of systems as included on the [facility] history form dated 01/31/2017, which I have signed.  Family History  (Detailed) Relationship Family Member Name Deceased Age at Death Condition Onset Age Cause of Death  Father    Diabetes-Type II    Mother    Arthritis/Gout    Mother    Cancer    Mother    Depression    Sister    Arthritis/Gout    Sister    High Choleterol      SOCIAL HISTORY  (Detailed) Tobacco use reviewed. The patient is left-handed.   Preferred language is [place].   MARITAL STATUS/FAMILY/SOCIAL SUPPORT Currently Married.   Has children: Smoking status: Never  smoker.  SMOKING STATUS Use Status Type Smoking Status Usage Per Day Years Used Total Pack Years  no/never  Never smoker       ALCOHOL There is a history of alcohol use.  Type: Beer and liquor. 3 drinks consumed weekly. Last alcoholic drink was yesterday.   HOME ENVIRONMENT/SAFETY The patient has not fallen in the last year.        MEDICATIONS(added, continued or stopped this visit): Started Medication Directions Instruction Stopped   Ambien 5 mg tablet take 1 tablet by oral route  every day at bedtime     Lipitor 20 mg tablet take 1 tablet by oral route  every day     Lyrica 150 mg capsule take 1 capsule by oral route 2 times every day     Lyrica 75 mg capsule take 1 capsule by oral route 2 times every day     Percocet 7.5 mg-325 mg tablet take 1 tablet by oral route  every 6 hours as needed       ALLERGIES: Ingredient Reaction Medication Name Comment  NO KNOWN ALLERGIES     No known allergies. Reviewed, no changes.    Vitals Date Temp F BP Pulse Ht In Wt Lb BMI BSA Pain Score  02/01/2017  167/90 69 71    3/10     PHYSICAL EXAM General Level of Distress: no acute distress Overall Appearance: normal  Head and Face  Right Left  Fundoscopic Exam:  normal normal    Cardiovascular Cardiac: regular rate and rhythm without murmur  Right Left  Carotid Pulses: normal normal  Respiratory Lungs: clear to auscultation  Neurological Orientation: normal Recent and Remote Memory: normal Attention Span and Concentration:   normal Language: normal Fund of Knowledge: normal  Right Left Sensation: normal normal Upper Extremity Coordination: normal normal  Lower Extremity Coordination: normal normal  Musculoskeletal Gait and Station: normal  Right Left Upper Extremity Muscle Strength: normal normal Lower Extremity Muscle Strength: normal normal Upper Extremity Muscle Tone:  normal normal Lower Extremity Muscle Tone: normal normal   Motor Strength Upper  and lower extremity motor strength was tested in the clinically pertinent muscles.     Deep Tendon Reflexes  Right Left Biceps: normal normal Triceps: normal normal Brachioradialis: normal normal Patellar: normal normal Achilles: normal normal  Sensory Sensation was tested at L1 to S1.   Cranial Nerves II. Optic Nerve/Visual Fields: normal III. Oculomotor: normal IV. Trochlear: normal V.  Trigeminal: normal VI. Abducens: normal VII. Facial: normal VIII. Acoustic/Vestibular: normal IX. Glossopharyngeal: normal X. Vagus: normal XI. Spinal Accessory: normal XII. Hypoglossal: normal  Motor and other Tests Lhermittes: negative Rhomberg: negative Pronator drift: absent     Right Left Hoffman's: normal normal Clonus: normal normal Babinski: normal normal SLR: negative negative Patrick's Pearlean Brownie(Faber): negative negative Toe Walk: normal normal Toe Lift: normal normal Heel Walk: normal normal SI Joint: nontender nontender   Additional Findings:  The patient appears to have extremely tight hamstrings and has very limited range of motion with bending.  He is only able to bend within 30 degrees off the vertical before he has significant back discomfort.  His paravertebral musculature appears to be quite tight.  He is quite uncomfortable and has to either sit or stand and moves about the examining room frequently in an effort to relieve his discomfort.    IMPRESSION I explained to the patient that he does have a mobile spondylolisthesis at the L4-5 level and it is likely that a lot of his back pain has to do with his body's effort to try to maintain stability at this mobile segment.  At the same time I am concerned that he has a great deal of facet arthropathy the L3-4 level with edematous changes in the L3-4 facet joints.  The L4-5 facet arthropathy appears to be fairly old and certainly does not have the appearance of acute inflammation as identified on the new MRI.  I suggested  that he have L3-4 facet blocks to see if this will help relieve his back pain and if it does not then he should undergo L4-5 facet blocks.  I explained that if he has significant facet arthropathy the L3-4 level which is symptomatic than an L4-5 fusion would likely set him up for worsening back pain rather  than improvement in his back pain.  In terms of the fusion options I would not recommend an anterior fusion at the L4-5 level given that he has posterior disease with a instability due to isthmic spondylolisthesisand spondylolysis.  If he is going to have surgery I would therefore recommend posterior decompression and fusion but before recommending any surgery would want make sure that the L3-4 level was not contributing to his current symptoms.  Completed Orders (this encounter) Order Details Reason Side Interpretation Result Initial Treatment Date Region  Lumbar Spine- AP/Lat/Flex/Ex      02/01/2017 All Levels to All Levels   Assessment/Plan # Detail Type Description   1. Assessment DDD (degenerative disc disease), lumbar (M51.36).       2. 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       3. Assessment Radiculopathy, lumbar region (M54.16).       4. Assessment Low back pain, unspecified back pain laterality, with sciatica presence unspecified (M54.5).       5. Assessment Spondylolisthesis, lumbar region (M43.16).   Plan Orders Lumbar Spine - Facet - L3-L4 - L4-L5.         Pain Assessment/Treatment Pain Scale: 3/10. Method: Numeric Pain Intensity Scale. Location: back. Duration: varies. Quality: discomforting. Pain Assessment/Treatment follow-up plan of care: Patient taking medication as prescribed..  Fall Risk Plan The patient has not fallen in the last year.  The patient and I had a lengthy discussion of treatment options and went over his radiographs and rationale for treatment.  He is going to go ahead with facet injections to see if this will give him any relief.   He is going to follow up  with me on an as-needed basis in the future.  Orders: Office Procedures/Services: Assessment Service Comments  M43.16 Lumbar Spine - Facet - L3-L4 - L4-L5    Diagnostic Procedures: Assessment Procedure  M43.16 Lumbar Spine- AP/Lat/Flex/Ex             Provider:  Danae Orleans. Venetia Maxon MD  02/03/2017 06:33 PM Dictation edited by: Danae Orleans. Venetia Maxon    CC Providers: Maeola Harman MD [address] [place], [place] [address]              Electronically signed by Danae Orleans. Venetia Maxon MD on 02/03/2017 06:33 PM

## 2017-03-29 NOTE — Progress Notes (Signed)
Awake, alert, conversant.  MAEW with good power.  Doing well.  

## 2017-03-29 NOTE — Anesthesia Postprocedure Evaluation (Signed)
Anesthesia Post Note  Patient: Omar PollackHenry L Duchesneau Jr.  Procedure(s) Performed: Procedure(s) (LRB): Right Lumbar four to five,  Anterolateral lumbar interbody fusion with percutaneous pedicle screws (Right) LUMBAR PERCUTANEOUS PEDICLE SCREW 1 LEVEL (N/A)     Patient location during evaluation: PACU Anesthesia Type: General Level of consciousness: awake and alert Pain management: pain level controlled Vital Signs Assessment: post-procedure vital signs reviewed and stable Respiratory status: spontaneous breathing, nonlabored ventilation, respiratory function stable and patient connected to nasal cannula oxygen Cardiovascular status: blood pressure returned to baseline and stable Postop Assessment: no signs of nausea or vomiting Anesthetic complications: no    Last Vitals:  Vitals:   03/29/17 1545 03/29/17 1600  BP: 123/69 132/72  Pulse: 79 84  Resp: 16 16  Temp:  36.4 C    Last Pain:  Vitals:   03/29/17 1545  TempSrc:   PainSc: 4                  Kennieth RadFitzgerald, Arush Gatliff E

## 2017-03-29 NOTE — Transfer of Care (Signed)
Immediate Anesthesia Transfer of Care Note  Patient: Omar PollackHenry L Hopfensperger Jr.  Procedure(s) Performed: Procedure(s) with comments: Right Lumbar four to five,  Anterolateral lumbar interbody fusion with percutaneous pedicle screws (Right) - Right Lumbar four to five, Anterolateral lumbar interbody fusion with percutaneous pedicle screws LUMBAR PERCUTANEOUS PEDICLE SCREW 1 LEVEL (N/A)  Patient Location: PACU  Anesthesia Type:General  Level of Consciousness: awake, alert , oriented and patient cooperative  Airway & Oxygen Therapy: Patient Spontanous Breathing and Patient connected to nasal cannula oxygen  Post-op Assessment: Report given to RN, Post -op Vital signs reviewed and stable and Patient moving all extremities X 4  Post vital signs: Reviewed and stable  Last Vitals:  Vitals:   03/29/17 0854  BP: (!) 146/73  Pulse: 65  Resp: 18  Temp: 37.1 C    Last Pain:  Vitals:   03/29/17 0917  TempSrc:   PainSc: 2          Complications: No apparent anesthesia complications

## 2017-03-30 ENCOUNTER — Encounter (HOSPITAL_COMMUNITY): Payer: Self-pay | Admitting: Neurosurgery

## 2017-03-30 NOTE — Progress Notes (Signed)
Pt doing well. Pt and wife given D/C instructions with Rx's, verbal understanding was provided. Pt's incision is clean and dry with no sign of infection. Pt's IV was removed prior to D/C. Pt D/C'd home via wheelchair @ 1140 per MD order. Pt is stable @ D/C and has no other needs at this time. Rema FendtAshley Salem Mastrogiovanni, RN

## 2017-03-30 NOTE — Progress Notes (Addendum)
Subjective: Patient reports "I think I'm doing well.  I have very similar pain...tightness...low back"  Objective: Vital signs in last 24 hours: Temp:  [97 F (36.1 C)-98.7 F (37.1 C)] 98.7 F (37.1 C) (06/01 0802) Pulse Rate:  [75-97] 75 (06/01 0802) Resp:  [9-24] 18 (06/01 0802) BP: (109-148)/(59-92) 109/80 (06/01 0802) SpO2:  [96 %-100 %] 98 % (06/01 0802)  Intake/Output from previous day: 05/31 0701 - 06/01 0700 In: 2680 [P.O.:480; I.V.:2200] Out: 3030 [Urine:2930; Blood:100] Intake/Output this shift: Total I/O In: -  Out: 250 [Urine:250]  Alert, sitting on edge of bed eating breakfast. Reports persistent lumbar pain, as expected, with slight left thigh pain. Full strength BLE. Incisions without erythema, swelling, or drainage right side and back beneath Dermabond and honeycomb drsgs.   Lab Results: No results for input(s): WBC, HGB, HCT, PLT in the last 72 hours. BMET No results for input(s): NA, K, CL, CO2, GLUCOSE, BUN, CREATININE, CALCIUM in the last 72 hours.  Studies/Results: Dg Lumbar Spine 2-3 Views  Result Date: 03/29/2017 CLINICAL DATA:  L4-5 interbody fusion EXAM: LUMBAR SPINE - 2-3 VIEW; DG C-ARM GT 120 MIN COMPARISON:  None. FLUOROSCOPY TIME:  Fluoroscopy Time:  3 minutes 38 seconds Radiation Exposure Index (if provided by the fluoroscopic device): Not available Number of Acquired Spot Images: 2 FINDINGS: Pedicle screws are noted at L4 and L5 with interbody fusion and posterior fixation. No acute abnormality is noted. IMPRESSION: L4-5 fusion. Electronically Signed   By: Alcide CleverMark  Lukens M.D.   On: 03/29/2017 14:48   Dg C-arm Gt 120 Min  Result Date: 03/29/2017 CLINICAL DATA:  L4-5 interbody fusion EXAM: LUMBAR SPINE - 2-3 VIEW; DG C-ARM GT 120 MIN COMPARISON:  None. FLUOROSCOPY TIME:  Fluoroscopy Time:  3 minutes 38 seconds Radiation Exposure Index (if provided by the fluoroscopic device): Not available Number of Acquired Spot Images: 2 FINDINGS: Pedicle screws are  noted at L4 and L5 with interbody fusion and posterior fixation. No acute abnormality is noted. IMPRESSION: L4-5 fusion. Electronically Signed   By: Alcide CleverMark  Lukens M.D.   On: 03/29/2017 14:48    Assessment/Plan: Improving  LOS: 1 day  Per DrStern, d/c to home. Pt verbalizes understanding of d/c instructions and has f/u appt scheduled. Questions answered. He has sufficient Percocet at home for prn use (Pain Mgt rx'ed for prior hip issue). Robaxin 500mg  1 po tid prn spasm #60 will be eRx'ed from office.    Georgiann Cockeroteat, Brian 03/30/2017, 9:28 AM   Patient is doing well.  Discharge home today.

## 2017-03-30 NOTE — Evaluation (Signed)
Occupational Therapy Evaluation Patient Details Name: Omar Conway. 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  He has a PMH significant for idiopathic neuropathy and psoas tendinopathy   Clinical Impression   Pt was admitted for the above sx. Pt is independent at baseline with ADLs and works.  Educated on back precautions, use of brace, and DME/AE.  He has AE from previous hip sxs.  Min guard for safety/cues to ensure back precautions are followed. No further OT is needed at this time    Follow Up Recommendations  No OT follow up;Supervision/Assistance - 24 hour (initially)    Equipment Recommendations  3 in 1 bedside commode    Recommendations for Other Services       Precautions / Restrictions Precautions Precautions: Back Precaution Booklet Issued: Yes (comment) Required Braces or Orthoses: Spinal Brace Spinal Brace: Applied in sitting position (may walk to bathroom without) Restrictions Weight Bearing Restrictions: No      Mobility Bed Mobility Overal bed mobility: Needs Assistance         Sit to supine: Supervision   General bed mobility comments: cues for back precautions  Transfers Overall transfer level: Needs assistance Equipment used: None Transfers: Sit to/from Stand Sit to Stand: Min guard         General transfer comment: for safety    Balance                                           ADL either performed or assessed with clinical judgement   ADL Overall ADL's : Needs assistance/impaired     Grooming: Oral care;Supervision/safety;Standing   Upper Body Bathing: Set up;Sitting   Lower Body Bathing: Min guard;Sit to/from stand;With adaptive equipment   Upper Body Dressing : Set up;Sitting   Lower Body Dressing: Min guard;Sit to/from stand;With adaptive equipment   Toilet Transfer: Min  guard;Ambulation;BSC;RW   Toileting- Water quality scientist and Hygiene: Min guard;Sit to/from stand         General ADL Comments: reviewed precautions and ADLs. Pt has AE kit.  Pt verbalizes understanding of all and handout given.  Reviewed AE and used sock aide.  Demonstrated use of 2 cups to brush teeth.       Vision         Perception     Praxis      Pertinent Vitals/Pain Pain Assessment: Faces Faces Pain Scale: Hurts a little bit Pain Location: back Pain Intervention(s): Limited activity within patient's tolerance;Monitored during session;Premedicated before session;Repositioned     Hand Dominance     Extremity/Trunk Assessment Upper Extremity Assessment Upper Extremity Assessment: Overall WFL for tasks assessed           Communication Communication Communication: No difficulties   Cognition Arousal/Alertness: Awake/alert Behavior During Therapy: WFL for tasks assessed/performed Overall Cognitive Status: Within Functional Limits for tasks assessed                                 General Comments: reports he feels a little fuzzy due to pain meds   General Comments       Exercises     Shoulder Instructions      Home Living Family/patient expects to be discharged  to:: Private residence Living Arrangements: Spouse/significant other                 Bathroom Shower/Tub: Occupational psychologist: [place]:  (AE kit)   Additional Comments: wife works. Pt wants to be as Independent as possible      Prior Functioning/Environment Level of Independence: Independent                 OT Problem List:        OT Treatment/Interventions:      OT Goals(Current goals can be found in the care plan section) Acute Rehab OT Goals Patient Stated Goal: heal well OT Goal Formulation: With patient Time For Goal Achievement: 04/06/17 Potential to Achieve Goals: Good  OT Frequency:     Barriers to D/C:             Co-evaluation              AM-PAC PT "6 Clicks" Daily Activity     Outcome Measure Help from another person eating meals?: None Help from another person taking care of personal grooming?: A Little Help from another person toileting, which includes using toliet, bedpan, or urinal?: A Little Help from another person bathing (including washing, rinsing, drying)?: A Little Help from another person to put on and taking off regular upper body clothing?: A Little Help from another person to put on and taking off regular lower body clothing?: A Little 6 Click Score: 19   End of Session    Activity Tolerance: Patient tolerated treatment well Patient left: in bed;with call bell/phone within reach  OT Visit Diagnosis: Muscle weakness (generalized) (M62.81)                Time: 8590-9311 OT Time Calculation (min): 35 min Charges:  OT General Charges $OT Visit: 1 Procedure OT Evaluation $OT Eval Low Complexity: 1 Procedure OT Treatments $Self Care/Home Management : 8-22 mins G-Codes:     Admire, OTR/L [phone] 03/30/2017  Mane Consolo 03/30/2017, 9:08 AM

## 2017-03-30 NOTE — Evaluation (Signed)
Physical Therapy Evaluation Patient Details Name: Omar Conway. 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  He has a PMH significant for idiopathic neuropathy and psoas tendinopathy  Clinical Impression  Patient evaluated by Physical Therapy with no further acute PT needs identified. All education has been completed and the patient has no further questions. At the time of PT eval pt was able to perform transfers and ambulation with gross modified independence and no AD. He was educated on stair training, car transfer, walking program, brace application and wearing schedule, and general safety with precautions. See below for any follow-up Physical Therapy or equipment needs. PT is signing off. Thank you for this referral.     Follow Up Recommendations No PT follow up;Supervision - Intermittent    Equipment Recommendations  None recommended by PT    Recommendations for Other Services       Precautions / Restrictions Precautions Precautions: Back Precaution Booklet Issued: Yes (comment) Required Braces or Orthoses: Spinal Brace Spinal Brace: Lumbar corset;Applied in sitting position (may walk to bathroom without) Restrictions Weight Bearing Restrictions: No      Mobility  Bed Mobility Overal bed mobility: Needs Assistance Bed Mobility: Rolling;Sidelying to Sit Rolling: Modified independent (Device/Increase time) Sidelying to sit: Modified independent (Device/Increase time)   Sit to supine: Supervision   General bed mobility comments: VC's for proper log roll technique. Pt was able to complete well without assist. HOB flat and rails lowered to simulate home environment.   Transfers Overall transfer level: Needs assistance Equipment used: None Transfers: Sit to/from Stand Sit to Stand: Min guard         General transfer comment: for  safety  Ambulation/Gait Ambulation/Gait assistance: Modified independent (Device/Increase time) Ambulation Distance (Feet): 400 Feet Assistive device: None Gait Pattern/deviations: Step-through pattern;Decreased stride length;Trunk flexed Gait velocity: Decreased Gait velocity interpretation: Below normal speed for age/gender General Gait Details: Pt was able to ambulate well with no LOB or unsteadiness noted. VC's for improved posture.   Stairs Stairs: Yes Stairs assistance: Supervision Stair Management: One rail Right;Step to pattern;Forwards Number of Stairs: 10 General stair comments: Pt was able to negotiate 1 flight of stairs without assistance. Supervision for safety provided however pt was essentially at a mod I level.   Wheelchair Mobility    Modified Rankin (Stroke Patients Only)       Balance Overall balance assessment: No apparent balance deficits (not formally assessed)                                           Pertinent Vitals/Pain Pain Assessment: Faces Faces Pain Scale: Hurts a little bit Pain Location: back Pain Descriptors / Indicators: Operative site guarding;Discomfort Pain Intervention(s): Limited activity within patient's tolerance;Monitored during session;Repositioned    Home Living Family/patient expects to be discharged to:: Private residence Living Arrangements: Spouse/significant other Available Help at Discharge: Family Type of Home: House Home Access: Stairs to enter Entrance Stairs-Rails: None Entrance Stairs-Number of Steps: 2 Home Layout: Two level;Able to live on main level with bedroom/bathroom Home Equipment:  (AE kit) Additional Comments: wife works. Pt wants to be as Independent as possible    Prior Function Level of Independence: Independent               Hand  Dominance   Dominant Hand: Right    Extremity/Trunk Assessment   Upper Extremity Assessment Upper Extremity Assessment: Overall WFL for  tasks assessed    Lower Extremity Assessment Lower Extremity Assessment: Generalized weakness (Consistent with pre-op diagnosis)    Cervical / Trunk Assessment Cervical / Trunk Assessment: Other exceptions Cervical / Trunk Exceptions: s/p surgery  Communication   Communication: No difficulties  Cognition Arousal/Alertness: Awake/alert Behavior During Therapy: WFL for tasks assessed/performed Overall Cognitive Status: Within Functional Limits for tasks assessed                                 General Comments: reports he feels a little fuzzy due to pain meds      General Comments      Exercises     Assessment/Plan    PT Assessment Patent does not need any further PT services  PT Problem List         PT Treatment Interventions      PT Goals (Current goals can be found in the Care Plan section)  Acute Rehab PT Goals Patient Stated Goal: heal well PT Goal Formulation: All assessment and education complete, DC therapy    Frequency     Barriers to discharge        Co-evaluation               AM-PAC PT "6 Clicks" Daily Activity  Outcome Measure Difficulty turning over in bed (including adjusting bedclothes, sheets and blankets)?: None Difficulty moving from lying on back to sitting on the side of the bed? : None Difficulty sitting down on and standing up from a chair with arms (e.g., wheelchair, bedside commode, etc,.)?: None Help needed moving to and from a bed to chair (including a wheelchair)?: None Help needed walking in hospital room?: None Help needed climbing 3-5 steps with a railing? : None 6 Click Score: 24    End of Session Equipment Utilized During Treatment: Back brace;Gait belt Activity Tolerance: Patient tolerated treatment well Patient left: in bed;with call bell/phone within reach (Sitting EOB) Nurse Communication: Mobility status PT Visit Diagnosis: Unsteadiness on feet (R26.81);Pain Pain - part of body:  (Back)    Time:  1610-9604 PT Time Calculation (min) (ACUTE ONLY): 23 min   Charges:   PT Evaluation $PT Eval Moderate Complexity: 1 Procedure PT Treatments $Gait Training: 8-22 mins   PT G Codes:        Omar Conway, PT, DPT Acute Rehabilitation Services Pager: [phone]   Omar Conway 03/30/2017, 11:47 AM

## 2017-03-30 NOTE — Discharge Instructions (Signed)

## 2017-03-30 NOTE — Discharge Summary (Signed)
Physician Discharge Summary  Patient ID: Omar PollackHenry L Reid Conway. MRN: [id number] DOB/AGE: [birth year] 66 y.o.  Admit date: 03/29/2017 Discharge date: 03/30/2017  Admission Diagnoses:Spondylolisthesis, Lumbar region with spondylolysis, stenosis, lumbago, radiculopathy L 45 level    Discharge Diagnoses:Spondylolisthesis, Lumbar region with spondylolysis, stenosis, lumbago, radiculopathy L 45 level s/p Right Lumbar four to five, Anterolateral lumbar interbody fusion with percutaneous pedicle screws (Right) - Right Lumbar four to five, Anterolateral lumbar interbody fusion with percutaneous pedicle screws LUMBAR PERCUTANEOUS PEDICLE SCREW 1 LEVEL (N/A)    Active Problems:   Spondylolisthesis of lumbar region   Discharged Condition: good  Hospital Course: Omar Conway was admitted for surgery with dx spondylolisthesis, stenosis, and radiculopathy. Following uncomplicated XLIF at L4-5 with percutaneous pedicle screw fixation, he recovered nicely and transferred to [facility] for nursing care and therapies. He is mobilizing well.   Consults: None  Significant Diagnostic Studies: radiology: X-Ray: intra-op  Treatments: surgery: Right Lumbar four to five, Anterolateral lumbar interbody fusion with percutaneous pedicle screws (Right) - Right Lumbar four to five, Anterolateral lumbar interbody fusion with percutaneous pedicle screws LUMBAR PERCUTANEOUS PEDICLE SCREW 1 LEVEL (N/A)    Discharge Exam: Blood pressure 109/80, pulse 75, temperature 98.7 F (37.1 C), temperature source Oral, resp. rate 18, height 5\' 11"  (1.803 m), weight 83 kg (183 lb), SpO2 98 %. Alert, sitting on edge of bed eating breakfast. Reports persistent lumbar pain, as expected, with slight left thigh pain. Full strength BLE. Incisions without erythema, swelling, or drainage right side and back beneath Dermabond and honeycomb drsgs.     Disposition: Discharge to home. Pt verbalizes understanding of d/c  instructions and has f/u appt scheduled. Questions answered. He has sufficient Percocet at home for prn use (Pain Mgt rx'ed for prior hip issue). Robaxin 500mg  1 po tid prn spasm #60 will be eRx'ed from office. Elevated commode seat for home.       Allergies as of 03/30/2017   No Known Allergies     Medication List    TAKE these medications   acetaminophen 500 MG tablet Commonly known as:  TYLENOL Take 1,000 mg by mouth every 6 (six) hours as needed (for pain).   atorvastatin 20 MG tablet Commonly known as:  LIPITOR Take 20 mg by mouth daily.   LYRICA 225 MG capsule Generic drug:  pregabalin Take 225 mg by mouth at bedtime.   oxyCODONE-acetaminophen 7.5-325 MG tablet Commonly known as:  PERCOCET Take 0.5 tablets by mouth 2 (two) times daily as needed (for pain.).   tamsulosin 0.4 MG Caps capsule Commonly known as:  FLOMAX Take 0.4 mg by mouth daily.   zolpidem 5 MG tablet Commonly known as:  AMBIEN Take 5 mg by mouth at bedtime as needed for sleep.            Durable Medical Equipment        Start     Ordered   03/30/17 0935  For home use only DME Eelevated commode seat  Once     03/30/17 0934       Signed: Dorian HeckleSTERN,Adelina Collard D, MD 03/30/2017, 10:05 AM

## 2017-03-30 NOTE — Progress Notes (Signed)
Pt received 3-n-1 from [facility] prior to D/C. Julien Oscar, RN  

## 2017-06-15 IMAGING — RF DG LUMBAR SPINE 2-3V
1 series · 2 of 2 positions shown · non-contrast
Comparison: None.

CLINICAL DATA: L4-5 interbody fusion

EXAM:
LUMBAR SPINE - 2-3 VIEW; DG C-ARM GT 120 MIN

[Series 1: run · 2 of 2 slices shown]
[im 1/2]
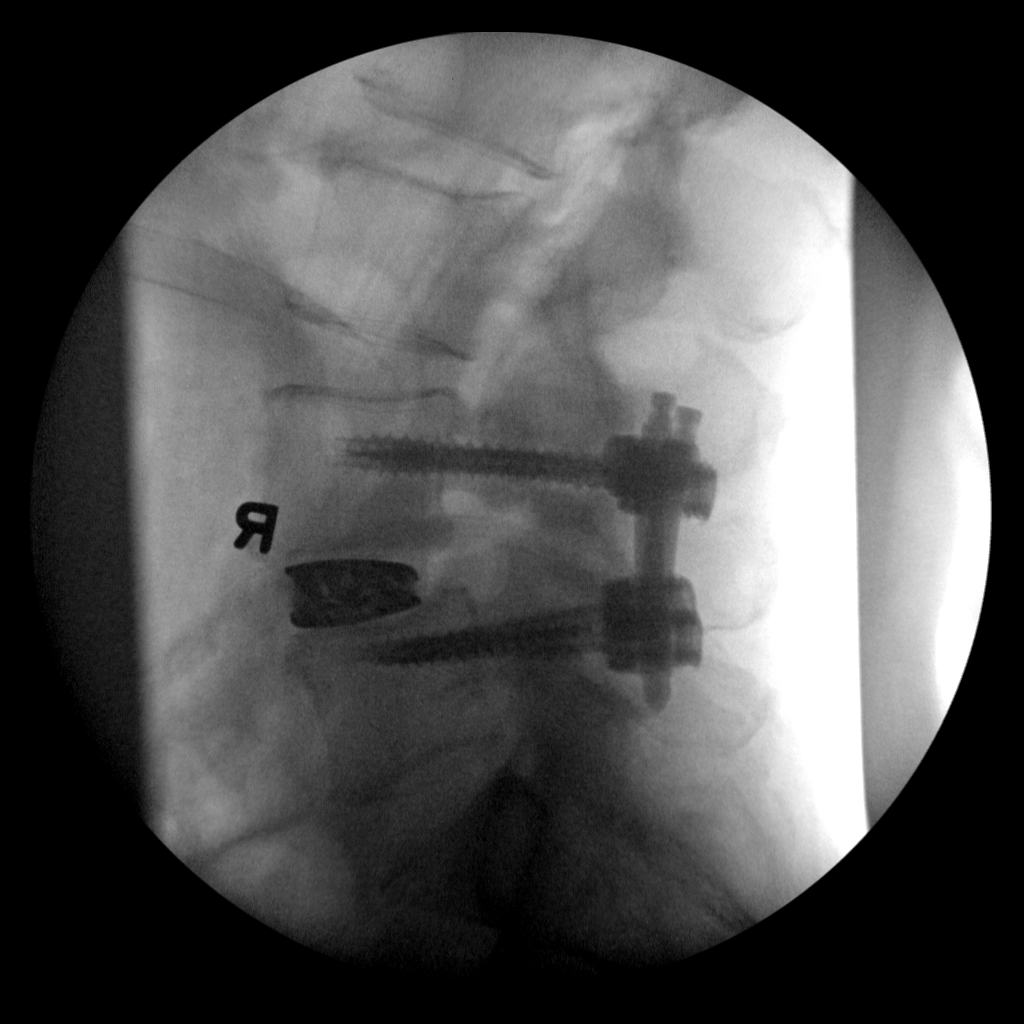
[im 2/2]
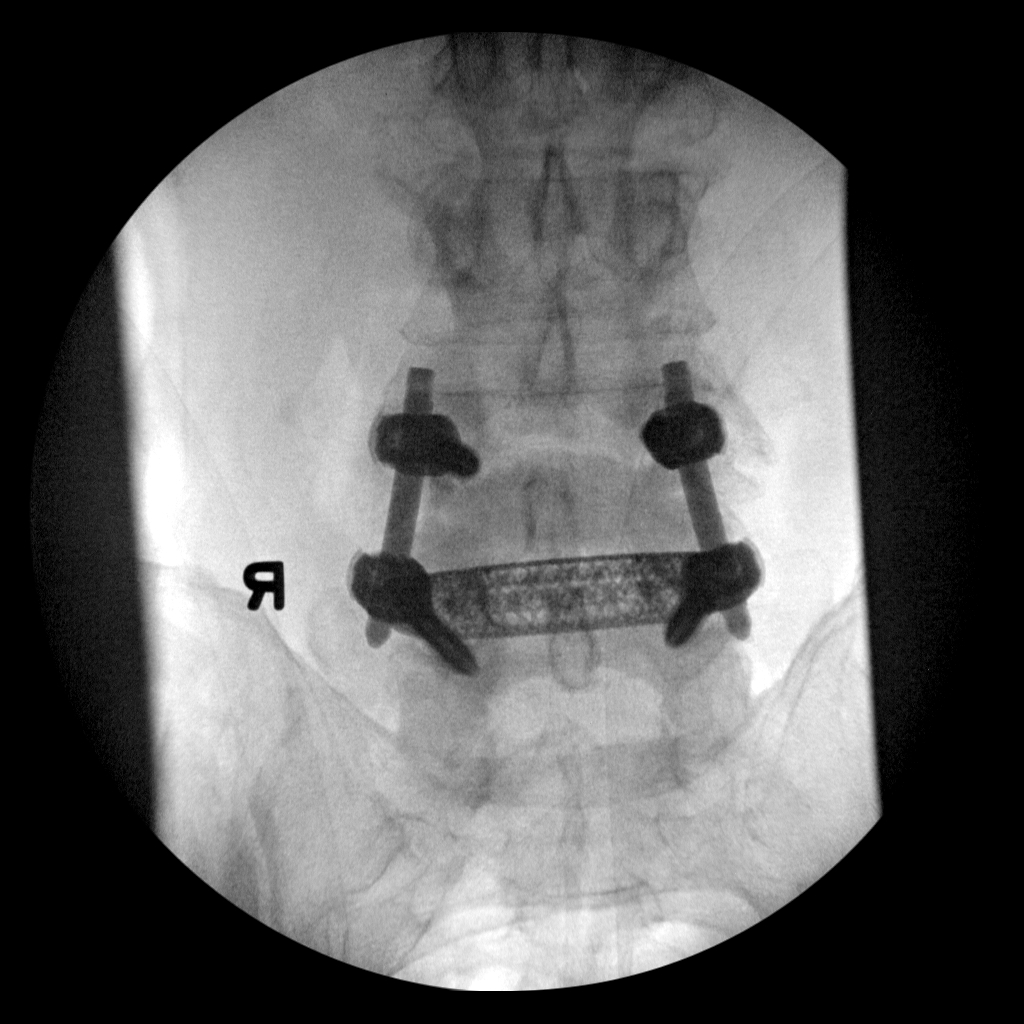

[2 of 2 positions shown; findings below may reference images not displayed]

FLUOROSCOPY TIME:  Fluoroscopy Time:  3 minutes 38 seconds

Radiation Exposure Index (if provided by the fluoroscopic device):
Not available

Number of Acquired Spot Images: 2
FINDINGS: Pedicle screws are noted at L4 and L5 with interbody fusion and
posterior fixation. No acute abnormality is noted.
IMPRESSION: L4-5 fusion.
